# Patient Record
Sex: Female | Born: 1970 | Race: Black or African American | Hispanic: No | Marital: Married | Smoking: Current every day smoker
Health system: Southern US, Community
[De-identification: ages and names within clinical notes are randomized; demographics above are authoritative.]

---

## 2006-10-09 ENCOUNTER — Emergency Department (HOSPITAL_COMMUNITY): Admission: EM | Admit: 2006-10-09 | Discharge: 2006-10-09 | Payer: Self-pay | Admitting: Family Medicine
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
## 2013-06-25 ENCOUNTER — Emergency Department (HOSPITAL_COMMUNITY)
Admission: EM | Admit: 2013-06-25 | Discharge: 2013-06-25 | Disposition: A | Discharge status: Home / Self Care | Payer: Self-pay | Attending: Emergency Medicine | Admitting: Emergency Medicine

## 2013-06-25 ENCOUNTER — Encounter (HOSPITAL_COMMUNITY): Payer: Self-pay | Admitting: Emergency Medicine

## 2013-06-25 DIAGNOSIS — M545 Low back pain, unspecified: Secondary | ICD-10-CM | POA: Insufficient documentation

## 2013-06-25 DIAGNOSIS — R110 Nausea: Secondary | ICD-10-CM | POA: Insufficient documentation

## 2013-06-25 DIAGNOSIS — R209 Unspecified disturbances of skin sensation: Secondary | ICD-10-CM | POA: Insufficient documentation

## 2013-06-25 DIAGNOSIS — F17200 Nicotine dependence, unspecified, uncomplicated: Secondary | ICD-10-CM | POA: Insufficient documentation

## 2013-06-25 DIAGNOSIS — M79609 Pain in unspecified limb: Secondary | ICD-10-CM | POA: Insufficient documentation

## 2013-06-25 MED ORDER — MELOXICAM 7.5 MG PO TABS: 7.5000 mg | ORAL_TABLET | Freq: Every day | ORAL | Status: DC

## 2013-06-25 MED ORDER — HYDROCODONE-ACETAMINOPHEN 5-325 MG PO TABS: 1.0000 | ORAL_TABLET | ORAL | Status: DC | PRN

## 2013-06-25 MED ORDER — CYCLOBENZAPRINE HCL 10 MG PO TABS
10.0000 mg | ORAL_TABLET | Freq: Two times a day (BID) | ORAL | Status: DC | PRN
Start: 2013-06-25 — End: 2013-11-12

## 2013-06-25 MED ORDER — METHYLPREDNISOLONE SODIUM SUCC 125 MG IJ SOLR
80.0000 mg | Freq: Once | INTRAMUSCULAR | Status: AC
  Administered 2013-06-25: 80 mg via INTRAMUSCULAR
  Filled 2013-06-25: qty 2

## 2013-06-25 NOTE — ED Provider Notes (Signed)
Medical screening examination/treatment/procedure(s) were performed by non-physician practitioner and as supervising physician I was immediately available for consultation/collaboration.   Tenishia Ekman L Dash Cardarelli, MD 06/25/13 1615 

## 2013-06-25 NOTE — ED Provider Notes (Signed)
CSN: [id number]     Arrival date & time 06/25/13  1341 History   First MD Initiated Contact with Patient 06/25/13 1444     Chief Complaint  Patient presents with  . Back Pain   (Consider location/radiation/quality/duration/timing/severity/associated sxs/prior Treatment) Patient is a 42 y.o. female presenting with back pain. The history is provided by the patient.  Back Pain Location:  Lumbar spine Quality:  Aching Radiates to:  R posterior upper leg and L posterior upper leg Pain severity:  Moderate (10/10) Onset quality:  Gradual Duration:  6 days Timing:  Constant Progression:  Worsening Associated symptoms: leg pain, numbness and tingling   Associated symptoms: no abdominal pain, no bladder incontinence, no bowel incontinence, no dysuria, no fever, no headaches and no weakness    Beverly Taylor is a 42 y.o. female who presents to the ED with low back pain. She has a history of sciatica but this is worse. Six day ago the pain started and then the next day she helped lift an older person and the pain got much worse. Today the pain is radiating to both legs.   History reviewed. No pertinent past medical history. History reviewed. No pertinent past surgical history. History reviewed. No pertinent family history. History  Substance Use Topics  . Smoking status: Current Every Day Smoker  . Smokeless tobacco: Not on file  . Alcohol Use: Yes   OB History   Grav Para Term Preterm Abortions TAB SAB Ect Mult Living                 Review of Systems  Constitutional: Negative for fever, chills and appetite change.  HENT: Negative for congestion, ear pain and sore throat.   Gastrointestinal: Positive for nausea. Negative for vomiting, abdominal pain and bowel incontinence.  Genitourinary: Negative for bladder incontinence, dysuria, urgency and frequency.  Musculoskeletal: Positive for back pain.  Skin: Negative for rash.  Neurological: Positive for tingling and numbness. Negative  for syncope, weakness and headaches.  Psychiatric/Behavioral: The patient is not nervous/anxious.     Allergies  Review of patient's allergies indicates not on file.  Home Medications  No current outpatient prescriptions on file. BP 148/93  Pulse 91  Temp(Src) 98.5 F (36.9 C) (Oral)  Resp 20  Ht 5\' 5"  (1.651 m)  Wt 190 lb (86.183 kg)  BMI 31.62 kg/m2  SpO2 99%  LMP 06/20/2013 Physical Exam  Nursing note and vitals reviewed. Constitutional: She is oriented to person, place, and time. She appears well-developed and well-nourished. No distress.  HENT:  Head: Normocephalic and atraumatic.  Eyes: Conjunctivae and EOM are normal.  Neck: Normal range of motion. Neck supple.  Cardiovascular: Normal rate and regular rhythm.   Pulmonary/Chest: Effort normal and breath sounds normal.  Abdominal: Soft. There is no tenderness.  Musculoskeletal:       Lumbar back: She exhibits spasm. She exhibits normal range of motion and no deformity.       Back:  Neurological: She is alert and oriented to person, place, and time. She has normal strength and normal reflexes. No cranial nerve deficit or sensory deficit. Gait normal.  Skin: Skin is warm and dry.  Psychiatric: She has a normal mood and affect. Her behavior is normal.    ED Course: I discussed this case with Dr. Estell Harpin  Procedures MDM  42 y.o. female with low back pain. History of sciatica. 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 Will treat with steroid injection and give muscle relaxant and pain medication.  She is to follow up with ortho. Patient stable for discharge home without any immediate complications. She remains neurovascularly intact.    Medication List         cyclobenzaprine 10 MG tablet  Commonly known as:  FLEXERIL  Take 1 tablet (10 mg total) by mouth 2 (two) times daily as needed for muscle spasms.     HYDROcodone-acetaminophen 5-325 MG per tablet  Commonly known as:  NORCO/VICODIN  Take 1 tablet by  mouth every 4 (four) hours as needed.     meloxicam 7.5 MG tablet  Commonly known as:  MOBIC  Take 1 tablet (7.5 mg total) by mouth daily.          Janne Napoleon, [place] 06/25/13 [phone]

## 2013-06-25 NOTE — ED Notes (Signed)
Pt being held from discharge for 15 minutes due to medication administration.

## 2013-06-25 NOTE — ED Notes (Signed)
Pt left at 1610 at the end of shock time.  Pt left prior to being assessed for affects of medication.  Pt verbalized understanding during medication administration to advise RN if she felt any adverse affects of medication prior to leaving.

## 2013-06-25 NOTE — ED Notes (Signed)
Low back pain for 6 days

## 2013-06-25 NOTE — ED Notes (Signed)
Pt c/o lower back pain that radiates to legs bilaterally since Wednesday. Pt has hx of sciatica.

## 2013-11-12 ENCOUNTER — Encounter (HOSPITAL_COMMUNITY): Payer: Self-pay | Admitting: Emergency Medicine

## 2013-11-12 ENCOUNTER — Emergency Department (HOSPITAL_COMMUNITY)
Admission: EM | Admit: 2013-11-12 | Discharge: 2013-11-12 | Disposition: A | Discharge status: Home / Self Care | Payer: Self-pay | Attending: Emergency Medicine | Admitting: Emergency Medicine

## 2013-11-12 DIAGNOSIS — IMO0002 Reserved for concepts with insufficient information to code with codable children: Secondary | ICD-10-CM

## 2013-11-12 DIAGNOSIS — L03019 Cellulitis of unspecified finger: Secondary | ICD-10-CM | POA: Insufficient documentation

## 2013-11-12 DIAGNOSIS — A499 Bacterial infection, unspecified: Secondary | ICD-10-CM | POA: Insufficient documentation

## 2013-11-12 DIAGNOSIS — IMO0001 Reserved for inherently not codable concepts without codable children: Secondary | ICD-10-CM

## 2013-11-12 DIAGNOSIS — F17200 Nicotine dependence, unspecified, uncomplicated: Secondary | ICD-10-CM | POA: Insufficient documentation

## 2013-11-12 DIAGNOSIS — B9689 Other specified bacterial agents as the cause of diseases classified elsewhere: Secondary | ICD-10-CM

## 2013-11-12 DIAGNOSIS — N760 Acute vaginitis: Secondary | ICD-10-CM | POA: Insufficient documentation

## 2013-11-12 DIAGNOSIS — R030 Elevated blood-pressure reading, without diagnosis of hypertension: Secondary | ICD-10-CM | POA: Insufficient documentation

## 2013-11-12 DIAGNOSIS — N390 Urinary tract infection, site not specified: Secondary | ICD-10-CM

## 2013-11-12 LAB — URINALYSIS, ROUTINE W REFLEX MICROSCOPIC
BILIRUBIN URINE: NEGATIVE
GLUCOSE, UA: NEGATIVE mg/dL
KETONES UR: NEGATIVE mg/dL
Nitrite: NEGATIVE
PH: 5.5 (ref 5.0–8.0)
Protein, ur: NEGATIVE mg/dL
Specific Gravity, Urine: 1.03 (ref 1.005–1.030)
UROBILINOGEN UA: 0.2 mg/dL (ref 0.0–1.0)

## 2013-11-12 LAB — URINE MICROSCOPIC-ADD ON

## 2013-11-12 LAB — WET PREP, GENITAL
Trich, Wet Prep: NONE SEEN
Yeast Wet Prep HPF POC: NONE SEEN

## 2013-11-12 MED ORDER — METRONIDAZOLE 500 MG PO TABS: 500.0000 mg | ORAL_TABLET | Freq: Two times a day (BID) | ORAL | Status: AC

## 2013-11-12 MED ORDER — CEPHALEXIN 500 MG PO CAPS: 500.0000 mg | ORAL_CAPSULE | Freq: Two times a day (BID) | ORAL | Status: AC

## 2013-11-12 MED ORDER — SULFAMETHOXAZOLE-TRIMETHOPRIM 800-160 MG PO TABS: 1.0000 | ORAL_TABLET | Freq: Two times a day (BID) | ORAL | Status: DC

## 2013-11-12 MED ORDER — IBUPROFEN 400 MG PO TABS
800.0000 mg | ORAL_TABLET | Freq: Once | ORAL | Status: AC
  Administered 2013-11-12: 800 mg via ORAL
  Filled 2013-11-12: qty 2

## 2013-11-12 NOTE — ED Provider Notes (Signed)
CSN: [id number]     Arrival date & time 11/12/13  1541 History  This chart was scribed for non-physician practitioner Lowella Dell, PA-C working with Layla Maw Ward, DO by Danella Maiers, ED Scribe. This patient was seen in room TR10C/TR10C and the patient's care was started at 5:32 PM.    Chief Complaint  Patient presents with  . Hand Pain  . Vaginitis   The history is provided by the patient. No language interpreter was used.   HPI Comments: Beverly Taylor is a 43 y.o. female who presents to the Emergency Department complaining of constant, throbbing pain and swelling to her right thumb onset 2 days ago after pulling a hangnail. She reports mild intermittent numbness to the area. She rates the severity of the pain as an 10 out of 10. She took Motrin with some relief. She dneies fever chills dizziness abdominal pain nausea vomiting. She has no allergies to medications.   She also reports dysuria and vaginal itching onset 3 days ago. She denies urinary frequency. She used tea tree oil which helped the dysuria. She denies new sexual partners.   History reviewed. No pertinent past medical history. History reviewed. No pertinent past surgical history. History reviewed. No pertinent family history. History  Substance Use Topics  . Smoking status: Current Every Day Smoker  . Smokeless tobacco: Not on file  . Alcohol Use: Yes   OB History   Grav Para Term Preterm Abortions TAB SAB Ect Mult Living                 Review of Systems  All other systems reviewed and are negative.      Allergies  Review of patient's allergies indicates no known allergies.  Home Medications   Current Outpatient Rx  Name  Route  Sig  Dispense  Refill  . ibuprofen (ADVIL,MOTRIN) 200 MG tablet   Oral   Take 200 mg by mouth every 6 (six) hours as needed for moderate pain.         . cephALEXin (KEFLEX) 500 MG capsule   Oral   Take 1 capsule (500 mg total) by mouth 2 (two) times daily.   14 capsule    0   . metroNIDAZOLE (FLAGYL) 500 MG tablet   Oral   Take 1 tablet (500 mg total) by mouth 2 (two) times daily. One po bid x 7 days   14 tablet   0    BP 183/103  Pulse 78  Temp(Src) 98.1 F (36.7 C) (Oral)  Resp 18  Wt 195 lb (88.451 kg)  SpO2 95%  LMP 11/07/2013 Physical Exam  Nursing note and vitals reviewed. Constitutional: She is oriented to person, place, and time. She appears well-developed and well-nourished. No distress.  HENT:  Head: Normocephalic and atraumatic.  Eyes: Conjunctivae are normal.  Neck: No JVD present. No tracheal deviation present.  Cardiovascular: Normal rate and regular rhythm.  Exam reveals no gallop and no friction rub.   No murmur heard. Pulmonary/Chest: Effort normal. No respiratory distress. She has no wheezes. She has no rhonchi. She has no rales.  Abdominal: Soft. There is no tenderness.  Genitourinary: Pelvic exam was performed with patient supine. There is no rash, tenderness, lesion or injury on the right labia. There is no rash, tenderness, lesion or injury on the left labia. Cervix exhibits no motion tenderness, no discharge and no friability. Right adnexum displays no mass, no tenderness and no fullness. Left adnexum displays no mass, no tenderness and no  fullness. No erythema, tenderness or bleeding around the vagina. No foreign body around the vagina. No signs of injury around the vagina. No vaginal discharge found.  Musculoskeletal: Normal range of motion. She exhibits no edema.  Right thumb on the radial side - paronychia. tender to touch, swelling, mild erythema. No sign of cellulitis. A little greenish yellow head. No numbness. sensation is intact. Good radial pulse.  Neurological: She is alert and oriented to person, place, and time.  Skin: Skin is warm and dry. She is not diaphoretic.  Psychiatric: She has a normal mood and affect. Her behavior is normal.    ED Course  Procedures (including critical care time) Medications   ibuprofen (ADVIL,MOTRIN) tablet 800 mg (800 mg Oral Given 11/12/13 1935)    DIAGNOSTIC STUDIES: Oxygen Saturation is 95% on RA, normal by my interpretation.    COORDINATION OF CARE: 6:32 PM- Discussed treatment plan with pt which includes I&D and pelvic exam. Pt agrees to plan.       Labs Review Labs Reviewed  WET PREP, GENITAL - Abnormal; Notable for the following:    Clue Cells Wet Prep HPF POC MANY (*)    WBC, Wet Prep HPF POC FEW (*)    All other components within normal limits  URINALYSIS, ROUTINE W REFLEX MICROSCOPIC - Abnormal; Notable for the following:    APPearance CLOUDY (*)    Hgb urine dipstick TRACE (*)    Leukocytes, UA MODERATE (*)    All other components within normal limits  URINE MICROSCOPIC-ADD ON - Abnormal; Notable for the following:    Squamous Epithelial / LPF FEW (*)    All other components within normal limits  GC/CHLAMYDIA PROBE AMP   Imaging Review No results found.   EKG Interpretation None     INCISION AND DRAINAGE Performed by: Rudene AndaLACKEY, Cha Gomillion, GRAY Consent: Verbal consent obtained. Risks and benefits: risks, benefits and alternatives were discussed Time out performed prior to procedure Type: Paronychia Body area: RIGHT Radial Thumb  Anesthesia:Digital Block Incision was made with a scalpel. Local anesthetic: lidocaine 2% w/o epinephrine Anesthetic total: 3 ml Complexity: simple Drainage: purulent Drainage amount: 1ml Packing material: N/A Patient tolerance: Patient tolerated the procedure well with no immediate complications.  MDM   Final diagnoses:  Paronychia  UTI (urinary tract infection)  Elevated BP  BV (bacterial vaginosis)    Patient afebrile.  Hypertensive on admission that improved throughout stay. Patient advised to follow up with PCP for recheck of BP.   UA shows moderate leukocytes with few squamous cells. Patient symptomatic for UTI. Plan to treat for UTI.  Wet prep consistent with BV. Will treat with  Flagyl. Paronychia successfully I&D. Patient tolerated procedure well. Advised 2 day follow up PCP or ED for reevaluation of wound. Patient agrees with plan. Discharged in good condition. Advised against alcohol use with Flagyl.   Meds given in ED:  Medications  ibuprofen (ADVIL,MOTRIN) tablet 800 mg (800 mg Oral Given 11/12/13 1935)    Discharge Medication List as of 11/12/2013  7:33 PM    START taking these medications   Details  cephALEXin (KEFLEX) 500 MG capsule Take 1 capsule (500 mg total) by mouth 2 (two) times daily., Starting 11/12/2013, Until Discontinued, Print    metroNIDAZOLE (FLAGYL) 500 MG tablet Take 1 tablet (500 mg total) by mouth 2 (two) times daily. One po bid x 7 days, Starting 11/12/2013, Until Discontinued, Print        I personally performed the services described in this documentation,  which was scribed in my presence. The recorded information has been reviewed and is accurate.   Rudene Anda, PA-C 11/15/13 1329

## 2013-11-12 NOTE — Discharge Instructions (Signed)
Follow up with your doctor or return to ED in 2 days for wound recheck.    Paronychia Paronychia is an inflammatory reaction involving the folds of the skin surrounding the fingernail. This is commonly caused by an infection in the skin around a nail. The most common cause of paronychia is frequent wetting of the hands (as seen with bartenders, food servers, nurses or others who wet their hands). This makes the skin around the fingernail susceptible to infection by bacteria (germs) or fungus. Other predisposing factors are:  Aggressive manicuring.  Nail biting.  Thumb sucking. The most common cause is a staphylococcal (a type of germ) infection, or a fungal (Candida) infection. When caused by a germ, it usually comes on suddenly with redness, swelling, pus and is often painful. It may get under the nail and form an abscess (collection of pus), or form an abscess around the nail. If the nail itself is infected with a fungus, the treatment is usually prolonged and may require oral medicine for up to one year. Your caregiver will determine the length of time treatment is required. The paronychia caused by bacteria (germs) may largely be avoided by not pulling on hangnails or picking at cuticles. When the infection occurs at the tips of the finger it is called felon. When the cause of paronychia is from the herpes simplex virus (HSV) it is called herpetic whitlow. TREATMENT  When an abscess is present treatment is often incision and drainage. This means that the abscess must be cut open so the pus can get out. When this is done, the following home care instructions should be followed. HOME CARE INSTRUCTIONS   It is important to keep the affected fingers very dry. Rubber or plastic gloves over cotton gloves should be used whenever the hand must be placed in water.  Keep wound clean, dry and dressed as suggested by your caregiver between warm soaks or warm compresses.  Soak in warm water for fifteen to  twenty minutes three to four times per day for bacterial infections. Fungal infections are very difficult to treat, so often require treatment for long periods of time.  For bacterial (germ) infections take antibiotics (medicine which kill germs) as directed and finish the prescription, even if the problem appears to be solved before the medicine is gone.  Only take over-the-counter or prescription medicines for pain, discomfort, or fever as directed by your caregiver. SEEK IMMEDIATE MEDICAL CARE IF:  You have redness, swelling, or increasing pain in the wound.  You notice pus coming from the wound.  You have a fever.  You notice a bad smell coming from the wound or dressing. Document Released: 02/22/2001 Document Revised: 11/21/2011 Document Reviewed: 10/24/2008 [facility] Patient Information 2014 [employer], [place].

## 2013-11-12 NOTE — ED Notes (Signed)
Pt presents with swelling around the cuticle of her Right thumb x2 days. Pt also presents vaginal itching and burning with urination x2-3 days. Pt also c/o pain/numbness to her Right lateral knee

## 2013-11-13 LAB — GC/CHLAMYDIA PROBE AMP
CT PROBE, AMP APTIMA: NEGATIVE
GC Probe RNA: NEGATIVE

## 2013-11-15 NOTE — ED Provider Notes (Signed)
Medical screening examination/treatment/procedure(s) were performed by non-physician practitioner and as supervising physician I was immediately available for consultation/collaboration.   EKG Interpretation None        Delylah Stanczyk N Emanuella Nickle, DO 11/15/13 1502 

## 2016-05-06 DIAGNOSIS — M722 Plantar fascial fibromatosis: Secondary | ICD-10-CM | POA: Insufficient documentation

## 2016-05-06 DIAGNOSIS — Z79899 Other long term (current) drug therapy: Secondary | ICD-10-CM | POA: Insufficient documentation

## 2016-05-06 DIAGNOSIS — R600 Localized edema: Secondary | ICD-10-CM | POA: Insufficient documentation

## 2016-05-06 DIAGNOSIS — F17200 Nicotine dependence, unspecified, uncomplicated: Secondary | ICD-10-CM | POA: Insufficient documentation

## 2016-05-07 ENCOUNTER — Encounter (HOSPITAL_COMMUNITY): Payer: Self-pay | Admitting: Emergency Medicine

## 2016-05-07 ENCOUNTER — Emergency Department (HOSPITAL_COMMUNITY): Payer: Self-pay

## 2016-05-07 ENCOUNTER — Emergency Department (HOSPITAL_COMMUNITY)
Admission: EM | Admit: 2016-05-07 | Discharge: 2016-05-07 | Disposition: A | Discharge status: Home / Self Care | Payer: Self-pay | Attending: Emergency Medicine | Admitting: Emergency Medicine

## 2016-05-07 DIAGNOSIS — R609 Edema, unspecified: Secondary | ICD-10-CM

## 2016-05-07 DIAGNOSIS — M722 Plantar fascial fibromatosis: Secondary | ICD-10-CM

## 2016-05-07 LAB — HEPATIC FUNCTION PANEL
ALT: 14 U/L (ref 14–54)
AST: 19 U/L (ref 15–41)
Albumin: 3.6 g/dL (ref 3.5–5.0)
Alkaline Phosphatase: 43 U/L (ref 38–126)
BILIRUBIN TOTAL: 0.1 mg/dL — AB (ref 0.3–1.2)
Total Protein: 6.2 g/dL — ABNORMAL LOW (ref 6.5–8.1)

## 2016-05-07 LAB — BASIC METABOLIC PANEL
ANION GAP: 8 (ref 5–15)
BUN: 11 mg/dL (ref 6–20)
CALCIUM: 9.2 mg/dL (ref 8.9–10.3)
CO2: 27 mmol/L (ref 22–32)
CREATININE: 0.77 mg/dL (ref 0.44–1.00)
Chloride: 106 mmol/L (ref 101–111)
GFR calc Af Amer: >60 mL/min (ref >60)
GLUCOSE: 137 mg/dL — AB (ref 65–99)
Potassium: 3.5 mmol/L (ref 3.5–5.1)
Sodium: 141 mmol/L (ref 135–145)

## 2016-05-07 LAB — CBC
HCT: 36.8 % (ref 36.0–46.0)
HEMOGLOBIN: 11.3 g/dL — AB (ref 12.0–15.0)
MCH: 26.4 pg (ref 26.0–34.0)
MCHC: 30.7 g/dL (ref 30.0–36.0)
MCV: 86 fL (ref 78.0–100.0)
PLATELETS: 323 10*3/uL (ref 150–400)
RBC: 4.28 MIL/uL (ref 3.87–5.11)
RDW: 15.3 % (ref 11.5–15.5)
WBC: 7.8 10*3/uL (ref 4.0–10.5)

## 2016-05-07 LAB — I-STAT TROPONIN, ED: TROPONIN I, POC: 0 ng/mL (ref 0.00–0.08)

## 2016-05-07 LAB — BRAIN NATRIURETIC PEPTIDE: B NATRIURETIC PEPTIDE 5: 14.4 pg/mL (ref 0.0–100.0)

## 2016-05-07 NOTE — ED Notes (Signed)
Pt states she has had swelling in her feet bilaterally for the last couple months starting in April. The left foot was the first to swell up then the right followed after a couple of weeks.  The pt says she thought this was just a side effect of working on her feet. The Pt says her left foot has been hurting for the last 4 months.  She was taking Goody powder for the pain and it would work for about 4 hours then the pain would return. Pain started as a dull ache in her heel but is now a sharp stabbing feeling. Pt states she has a hard time putting pressure on the heel lately. Pt reports that the right foot has been feeling numb for the past 3 months.  The pain in her right foot is mostly a "pins and needles' sensation but can be sharp and very painful at times.

## 2016-05-07 NOTE — Discharge Instructions (Signed)
You liver tests and test of the stretch of your heart are normal.  Try and elevate your legs above the level of your heart.

## 2016-05-07 NOTE — ED Provider Notes (Signed)
[facility]-EMERGENCY DEPT Provider Note   CSN: [id number] Arrival date & time: 05/06/16  2356  By signing my name below, I, Beverly Taylor, attest that this documentation has been prepared under the direction and in the presence of Beverly Plan, DO. Electronically Signed: Phillis Taylor, ED Scribe. 05/07/16. 3:01 AM.  History   Chief Complaint Chief Complaint  Patient presents with  . Leg Swelling   The history is provided by the patient. No language interpreter was used.  HPI Comments: Beverly Taylor is a 45 y.o. female who presents to the Emergency Department complaining of intermittent bilateral leg swelling onset 4 months ago. Pt reports that the swelling has been constant recently. She states that the swelling will typically relieve at night while in bed. Pt reports numbness to the right mid foot to the toes and pain to her left heel. She has not been evaluated for this yet because she attributed her symptoms to working long hours on her feet. Pt has been taking Goody powder for her pain. She denies chest pain or SOB.  History reviewed. No pertinent past medical history.  There are no active problems to display for this patient.   History reviewed. No pertinent surgical history.  OB History    No data available       Home Medications    Prior to Admission medications   Medication Sig Start Date End Date Taking? Authorizing Provider  cephALEXin (KEFLEX) 500 MG capsule Take 1 capsule (500 mg total) by mouth 2 (two) times daily. 11/12/13   Cristobal Goldmann, PA-C  ibuprofen (ADVIL,MOTRIN) 200 MG tablet Take 200 mg by mouth every 6 (six) hours as needed for moderate pain.    Historical Provider, MD  metroNIDAZOLE (FLAGYL) 500 MG tablet Take 1 tablet (500 mg total) by mouth 2 (two) times daily. One po bid x 7 days 11/12/13   Cristobal Goldmann, PA-C    Family History No family history on file.  Social History Social History  Substance Use Topics  . Smoking status: Current Every Day Smoker  .  Smokeless tobacco: Never Used  . Alcohol use Yes     Allergies   Review of patient's allergies indicates no known allergies.   Review of Systems Review of Systems  Constitutional: Negative for chills and fever.  HENT: Negative for congestion and rhinorrhea.   Eyes: Negative for redness and visual disturbance.  Respiratory: Negative for shortness of breath and wheezing.   Cardiovascular: Positive for leg swelling. Negative for chest pain and palpitations.  Gastrointestinal: Negative for nausea and vomiting.  Genitourinary: Negative for dysuria and urgency.  Musculoskeletal: Positive for arthralgias. Negative for myalgias.  Skin: Negative for pallor and wound.  Neurological: Positive for numbness. Negative for dizziness and headaches.     Physical Exam Updated Vital Signs BP 115/68   Pulse 69   Temp 98.2 F (36.8 C) (Oral)   Resp 16   Ht 5\' 6"  (1.676 m)   Wt 266 lb 6 oz (120.8 kg)   LMP 05/06/2016 (Exact Date)   SpO2 100%   BMI 42.99 kg/m   Physical Exam  Constitutional: She is oriented to person, place, and time. She appears well-developed and well-nourished. No distress.  HENT:  Head: Normocephalic and atraumatic.  Eyes: EOM are normal. Pupils are equal, round, and reactive to light.  Neck: Normal range of motion. Neck supple.  Cardiovascular: Normal rate and regular rhythm.  Exam reveals no gallop and no friction rub.   No murmur heard. Pulmonary/Chest: Effort normal.  She has no wheezes. She has no rales.  Abdominal: Soft. She exhibits no distension. There is no tenderness.  Musculoskeletal: She exhibits edema. She exhibits no tenderness.  1+ - 2+ pitting edema to BLE up to the knees; dark discoloration of the soles of her feet  Neurological: She is alert and oriented to person, place, and time.  Skin: Skin is warm and dry. She is not diaphoretic.  Psychiatric: She has a normal mood and affect. Her behavior is normal.  Nursing note and vitals reviewed.    ED  Treatments / Results   DIAGNOSTIC STUDIES: Oxygen Saturation is 100% on RA, normal by my interpretation.    COORDINATION OF CARE: 3:00 AM-Discussed treatment Taylor which includes labs with pt at bedside and pt agreed to Taylor.   Labs (all labs ordered are listed, but only abnormal results are displayed) Labs Reviewed  BASIC METABOLIC PANEL - Abnormal; Notable for the following:       Result Value   Glucose, Bld 137 (*)    All other components within normal limits  CBC - Abnormal; Notable for the following:    Hemoglobin 11.3 (*)    All other components within normal limits  HEPATIC FUNCTION PANEL - Abnormal; Notable for the following:    Total Protein 6.2 (*)    Total Bilirubin 0.1 (*)    Bilirubin, Direct <0.1 (*)    All other components within normal limits  BRAIN NATRIURETIC PEPTIDE  I-STAT TROPOININ, ED    EKG  EKG Interpretation  Date/Time:  Saturday May 07 2016 00:27:24 EDT Ventricular Rate:  82 PR Interval:  158 QRS Duration: 82 QT Interval:  382 QTC Calculation: 446 R Axis:   10 Text Interpretation:  Normal sinus rhythm Possible Anterior infarct , age undetermined Abnormal ECG No old tracing to compare Confirmed by Johnchristopher Sarvis MD, DANIEL [phone]([id number]) on 05/07/2016 2:32:29 AM       Radiology Dg Chest 2 View  Result Date: 05/07/2016 CLINICAL DATA:  Lower extremity edema. EXAM: CHEST  2 VIEW COMPARISON:  None. FINDINGS: The heart size and mediastinal contours are within normal limits. Both lungs are clear. The visualized skeletal structures are unremarkable. IMPRESSION: Normal chest radiographs. Electronically Signed   By: Awilda Metroourtnay  Bloomer M.D.   On: 05/07/2016 00:47    Procedures Procedures (including critical care time)  Medications Ordered in ED Medications - No data to display   Initial Impression / Assessment and Taylor / ED Course  I have reviewed the triage vital signs and the nursing notes.  Pertinent labs & imaging results that were available during my care  of the patient were reviewed by me and considered in my medical decision making (see chart for details).  Clinical Course    45 yo F with a cc of bilateral leg swelling.  Going on for 3 months.  Also having foot pain with first steps in the morning.  Clinically with plantar fasciitis and likely dependent edema.  Labs reassuring.  D/c home.   4:22 AM:  I have discussed the diagnosis/risks/treatment options with the patient and family and believe the pt to be eligible for discharge home to follow-up with PCP. We also discussed returning to the ED immediately if new or worsening sx occur. We discussed the sx which are most concerning (e.g., sudden worsening pain, fever, inability to tolerate by mouth) that necessitate immediate return. Medications administered to the patient during their visit and any new prescriptions provided to the patient are listed below.  Medications given  during this visit Medications - No data to display   The patient appears reasonably screen and/or stabilized for discharge and I doubt any other medical condition or other [facility] requiring further screening, evaluation, or treatment in the ED at this time prior to discharge.    Final Clinical Impressions(s) / ED Diagnoses   Final diagnoses:  Peripheral edema  Plantar fasciitis   I personally performed the services described in this documentation, which was scribed in my presence. The recorded information has been reviewed and is accurate.   New Prescriptions New Prescriptions   No medications on file     Beverly Plan, DO 05/07/16 0423

## 2016-05-07 NOTE — ED Triage Notes (Signed)
C/o swelling to bilateral lower extremities x 2-3 months that is getting worse.  Also reports pain to bottom of L foot.  States she is on her feet a lot at work and her mother is currently in the hospital as a patient so she decided to come have it checked.  Denies sob and chest pain.
